# Patient Record
Sex: Male | Born: 1965 | Race: Black or African American | Hispanic: No | Marital: Married | Smoking: Never smoker
Health system: Southern US, Community
[De-identification: ages and names within clinical notes are randomized; demographics above are authoritative.]

## PROBLEM LIST (undated history)

## (undated) DIAGNOSIS — I10 Essential (primary) hypertension: Secondary | ICD-10-CM

## (undated) DIAGNOSIS — G479 Sleep disorder, unspecified: Secondary | ICD-10-CM

## (undated) DIAGNOSIS — K219 Gastro-esophageal reflux disease without esophagitis: Secondary | ICD-10-CM

## (undated) DIAGNOSIS — M25569 Pain in unspecified knee: Secondary | ICD-10-CM

## (undated) DIAGNOSIS — E7800 Pure hypercholesterolemia, unspecified: Secondary | ICD-10-CM

## (undated) HISTORY — PX: HAND SURGERY: SHX662

## (undated) HISTORY — PX: KNEE SURGERY: SHX244

---

## 2014-03-11 ENCOUNTER — Ambulatory Visit: Payer: Self-pay | Admitting: Family Medicine

## 2014-03-22 ENCOUNTER — Ambulatory Visit: Payer: Self-pay | Admitting: Family Medicine

## 2014-12-22 ENCOUNTER — Encounter: Admit: 2014-12-22 | Disposition: A | Discharge status: Home / Self Care | Payer: Self-pay | Admitting: Family Medicine

## 2015-01-07 ENCOUNTER — Encounter
Admit: 2015-01-07 | Disposition: A | Discharge status: Home / Self Care | Payer: Self-pay | Attending: Family Medicine | Admitting: Family Medicine

## 2015-03-17 ENCOUNTER — Other Ambulatory Visit: Payer: Self-pay | Admitting: Family Medicine

## 2015-03-17 DIAGNOSIS — M25512 Pain in left shoulder: Principal | ICD-10-CM

## 2015-03-17 DIAGNOSIS — G8929 Other chronic pain: Secondary | ICD-10-CM

## 2015-03-24 ENCOUNTER — Ambulatory Visit: Admission: RE | Admit: 2015-03-24 | Payer: BC Managed Care – PPO | Source: Ambulatory Visit

## 2016-06-18 ENCOUNTER — Other Ambulatory Visit: Payer: Self-pay | Admitting: Family Medicine

## 2016-06-18 ENCOUNTER — Ambulatory Visit
Admission: RE | Admit: 2016-06-18 | Discharge: 2016-06-18 | Disposition: A | Discharge status: Home / Self Care | Payer: BC Managed Care – PPO | Source: Ambulatory Visit | Attending: Family Medicine | Admitting: Family Medicine

## 2016-06-18 DIAGNOSIS — M545 Low back pain: Secondary | ICD-10-CM | POA: Diagnosis present

## 2016-06-18 DIAGNOSIS — M5136 Other intervertebral disc degeneration, lumbar region: Secondary | ICD-10-CM | POA: Insufficient documentation

## 2016-06-18 DIAGNOSIS — M5134 Other intervertebral disc degeneration, thoracic region: Secondary | ICD-10-CM | POA: Insufficient documentation

## 2016-08-29 ENCOUNTER — Other Ambulatory Visit: Payer: Self-pay | Admitting: Obstetrics and Gynecology

## 2016-08-29 DIAGNOSIS — M545 Low back pain: Secondary | ICD-10-CM

## 2016-09-13 ENCOUNTER — Telehealth: Payer: Self-pay

## 2016-09-14 ENCOUNTER — Other Ambulatory Visit: Payer: BC Managed Care – PPO

## 2016-09-14 ENCOUNTER — Ambulatory Visit: Payer: BC Managed Care – PPO

## 2016-09-27 ENCOUNTER — Ambulatory Visit

## 2016-11-29 ENCOUNTER — Ambulatory Visit
Admission: RE | Admit: 2016-11-29 | Discharge: 2016-11-29 | Disposition: A | Discharge status: Home / Self Care | Payer: BC Managed Care – PPO | Source: Ambulatory Visit | Attending: Obstetrics and Gynecology | Admitting: Obstetrics and Gynecology

## 2016-11-29 DIAGNOSIS — M5126 Other intervertebral disc displacement, lumbar region: Secondary | ICD-10-CM | POA: Insufficient documentation

## 2016-11-29 DIAGNOSIS — M48061 Spinal stenosis, lumbar region without neurogenic claudication: Secondary | ICD-10-CM | POA: Insufficient documentation

## 2016-11-29 DIAGNOSIS — M545 Low back pain: Secondary | ICD-10-CM

## 2018-10-26 ENCOUNTER — Other Ambulatory Visit: Payer: Self-pay

## 2018-10-26 ENCOUNTER — Ambulatory Visit
Admission: EM | Admit: 2018-10-26 | Discharge: 2018-10-26 | Disposition: A | Discharge status: Home / Self Care | Payer: BC Managed Care – PPO | Attending: Family Medicine | Admitting: Family Medicine

## 2018-10-26 ENCOUNTER — Encounter: Payer: Self-pay | Admitting: Gynecology

## 2018-10-26 DIAGNOSIS — B029 Zoster without complications: Secondary | ICD-10-CM | POA: Insufficient documentation

## 2018-10-26 HISTORY — DX: Essential (primary) hypertension: I10

## 2018-10-26 HISTORY — DX: Pure hypercholesterolemia, unspecified: E78.00

## 2018-10-26 HISTORY — DX: Sleep disorder, unspecified: G47.9

## 2018-10-26 HISTORY — DX: Gastro-esophageal reflux disease without esophagitis: K21.9

## 2018-10-26 HISTORY — DX: Pain in unspecified knee: M25.569

## 2018-10-26 MED ORDER — VALACYCLOVIR HCL 1 G PO TABS: 1000.0000 mg | ORAL_TABLET | Freq: Three times a day (TID) | ORAL | 0 refills | Status: AC

## 2018-10-26 NOTE — ED Provider Notes (Signed)
[facility]    CSN: [id number] Arrival date & time: 10/26/18  1217     History   Chief Complaint Chief Complaint  Patient presents with  . Rash    HPI Ronald Woods is a 53 y.o. male.   53 yo male with a c/o a painful rash on right upper back and extending around the side towards the chest. Denies any fevers, chills.   The history is provided by the patient.    Past Medical History:  Diagnosis Date  . GERD (gastroesophageal reflux disease)   . Hypercholesteremia   . Hypertension   . Knee pain   . Sleep disorder     There are no active problems to display for this patient.   Past Surgical History:  Procedure Laterality Date  . HAND SURGERY    . KNEE SURGERY         Home Medications    Prior to Admission medications   Medication Sig Start Date End Date Taking? Authorizing Provider  amoxicillin (AMOXIL) 500 MG tablet Take 500 mg by mouth 2 (two) times daily.   Yes [provider]  atorvastatin (LIPITOR) 40 MG tablet Take by mouth. 07/12/15  Yes [provider]  cetirizine-pseudoephedrine (ZYRTEC-D) 5-120 MG tablet  06/15/14  Yes [provider]  Cholecalciferol (D 5000) 125 MCG (5000 UT) capsule Take by mouth. 06/15/14  Yes [provider]  diclofenac sodium (VOLTAREN) 1 % GEL Apply 1 % topically. 12/07/15  Yes [provider]  DULoxetine (CYMBALTA) 60 MG capsule Take by mouth. 06/18/18  Yes [provider]  fluticasone (FLONASE) 50 MCG/ACT nasal spray 1 spray by Each Nare route daily as needed. 06/15/14  Yes [provider]  gabapentin (NEURONTIN) 300 MG capsule Take by mouth. 05/28/17  Yes [provider]  Ibuprofen 200 MG CAPS Take by mouth. 04/23/05  Yes [provider]  meloxicam (MOBIC) 15 MG tablet Take by mouth. 05/28/17  Yes [provider]  orlistat (XENICAL) 120 MG capsule USE THREE TIMES DAILY AS NEEDED PRIOR TO FATTY MEALS 06/28/16  Yes [provider]   traZODone (DESYREL) 50 MG tablet Take 1/2 to 1 tablet at bed time as needed for sleep and mood 08/27/18  Yes [provider]  docusate sodium (COLACE) 100 MG capsule Take by mouth.    [provider]  Omeprazole 20 MG TBEC Take by mouth.    [provider]  oxyCODONE (ROXICODONE) 15 MG immediate release tablet Take by mouth.    [provider]  valACYclovir (VALTREX) 1000 MG tablet Take 1 tablet (1,000 mg total) by mouth 3 (three) times daily. 10/26/18   Payton Mccallum, MD    Family History Family History  Problem Relation Age of Onset  . Cancer Mother   . Cancer Father     Social History Social History   Tobacco Use  . Smoking status: Never Smoker  . Smokeless tobacco: Never Used  Substance Use Topics  . Alcohol use: Yes  . Drug use: Never     Allergies   Patient has no known allergies.   Review of Systems Review of Systems   Physical Exam Triage Vital Signs ED Triage Vitals [10/26/18 1233]  Enc Vitals Group     BP 127/90     Pulse Rate 79     Resp 16     Temp 98.1 F (36.7 C)     Temp Source Oral     SpO2 99 %  Weight 23 lb (10.4 kg)     Height 5\' 11"  (1.803 m)     Head Circumference      Peak Flow      Pain Score 6     Pain Loc      Pain Edu?      Excl. in GC?    No data found.  Updated Vital Signs BP 127/90 (BP Location: Left Arm)   Pulse 79   Temp 98.1 F (36.7 C) (Oral)   Resp 16   Ht 5\' 11"  (1.803 m)   Wt 10.4 kg   SpO2 99%   BMI 3.21 kg/m   Visual Acuity Right Eye Distance:   Left Eye Distance:   Bilateral Distance:    Right Eye Near:   Left Eye Near:    Bilateral Near:     Physical Exam Vitals signs and nursing note reviewed.  Constitutional:      General: He is not in acute distress.    Appearance: Normal appearance. He is not ill-appearing, toxic-appearing or diaphoretic.  Skin:    Findings: Rash present. Rash is vesicular.       Neurological:     Mental Status: He is alert.       UC Treatments / Results  Labs (all labs ordered are listed, but only abnormal results are displayed) Labs Reviewed - No data to display  EKG None  Radiology No results found.  Procedures Procedures (including critical care time)  Medications Ordered in UC Medications - No data to display  Initial Impression / Assessment and Plan / UC Course  I have reviewed the triage vital signs and the nursing notes.  Pertinent labs & imaging results that were available during my care of the patient were reviewed by me and considered in my medical decision making (see chart for details).      Final Clinical Impressions(s) / UC Diagnoses   Final diagnoses:  Herpes zoster without complication    ED Prescriptions    Medication Sig Dispense Auth. Provider   valACYclovir (VALTREX) 1000 MG tablet Take 1 tablet (1,000 mg total) by mouth 3 (three) times daily. 21 tablet Payton Mccallumonty, Lindsea Olivar, MD     1. diagnosis reviewed with patient 2. rx as per orders above; reviewed possible side effects, interactions, risks and benefits  3. Recommend supportive treatment with otc analgesics prn 4. Follow-up prn if symptoms worsen or don't improve   Controlled Substance Prescriptions [place] Controlled Substance Registry consulted? Not Applicable   Payton Mccallumonty, Khaya Theissen, MD 10/26/18 1253

## 2018-10-26 NOTE — ED Triage Notes (Signed)
Patient c/o blister rash at upper back. Per patient very painful

## 2018-11-11 ENCOUNTER — Ambulatory Visit
Admission: RE | Admit: 2018-11-11 | Discharge: 2018-11-11 | Disposition: A | Discharge status: Home / Self Care | Source: Ambulatory Visit | Attending: Obstetrics and Gynecology | Admitting: Obstetrics and Gynecology

## 2018-11-11 ENCOUNTER — Other Ambulatory Visit: Payer: Self-pay | Admitting: Obstetrics and Gynecology

## 2018-11-11 DIAGNOSIS — J069 Acute upper respiratory infection, unspecified: Secondary | ICD-10-CM

## 2018-11-11 DIAGNOSIS — R918 Other nonspecific abnormal finding of lung field: Secondary | ICD-10-CM | POA: Insufficient documentation

## 2019-12-28 ENCOUNTER — Ambulatory Visit: Attending: Internal Medicine

## 2019-12-28 DIAGNOSIS — Z23 Encounter for immunization: Secondary | ICD-10-CM

## 2019-12-28 NOTE — Progress Notes (Signed)
   Covid-19 Vaccination Clinic  Name:  Ronald Woods    MRN: [id number] DOB: 1965/12/12  12/28/2019  [name] was observed post Covid-19 immunization for 15 minutes without incident. He was provided with Vaccine Information Sheet and instruction to access the V-Safe system.   [name] was instructed to call 911 with any severe reactions post vaccine: Marland Kitchen Difficulty breathing  . Swelling of face and throat  . A fast heartbeat  . A bad rash all over body  . Dizziness and weakness   Immunizations Administered    Name Date Dose VIS Date Route   Pfizer COVID-19 Vaccine 12/28/2019 12:14 PM 0.3 mL 09/18/2019 Intramuscular   Manufacturer: [employer], [employer]   Lot: [id number]   NDC: [address]-2

## 2020-01-20 ENCOUNTER — Ambulatory Visit: Attending: Internal Medicine

## 2020-01-20 DIAGNOSIS — Z23 Encounter for immunization: Secondary | ICD-10-CM

## 2020-01-20 NOTE — Progress Notes (Signed)
   Covid-19 Vaccination Clinic  Name:  Ronald Woods    MRN: [id number] DOB: September 12, 1966  01/20/2020  Ronald Woods was observed post Covid-19 immunization for 15 minutes without incident. He was provided with Vaccine Information Sheet and instruction to access the V-Safe system.   Ronald Woods was instructed to call 911 with any severe reactions post vaccine: Marland Kitchen Difficulty breathing  . Swelling of face and throat  . A fast heartbeat  . A bad rash all over body  . Dizziness and weakness   Immunizations Administered    Name Date Dose VIS Date Route   Pfizer COVID-19 Vaccine 01/20/2020  1:31 PM 0.3 mL 09/18/2019 Intramuscular   Manufacturer: [employer], [employer]   Lot: [id number]   NDC: [address]-3

## 2020-09-19 ENCOUNTER — Other Ambulatory Visit: Payer: Self-pay | Admitting: Obstetrics and Gynecology

## 2020-09-19 DIAGNOSIS — M25562 Pain in left knee: Secondary | ICD-10-CM

## 2020-09-19 DIAGNOSIS — R609 Edema, unspecified: Secondary | ICD-10-CM

## 2020-09-27 ENCOUNTER — Ambulatory Visit
Admission: RE | Admit: 2020-09-27 | Discharge: 2020-09-27 | Disposition: A | Discharge status: Home / Self Care | Source: Ambulatory Visit | Attending: Obstetrics and Gynecology | Admitting: Obstetrics and Gynecology

## 2020-09-27 ENCOUNTER — Other Ambulatory Visit (HOSPITAL_COMMUNITY): Payer: Self-pay | Admitting: Obstetrics and Gynecology

## 2020-09-27 ENCOUNTER — Other Ambulatory Visit: Payer: Self-pay

## 2020-09-27 DIAGNOSIS — M25562 Pain in left knee: Secondary | ICD-10-CM | POA: Diagnosis present

## 2020-09-27 DIAGNOSIS — R609 Edema, unspecified: Secondary | ICD-10-CM | POA: Diagnosis present

## 2020-09-27 DIAGNOSIS — S83206A Unspecified tear of unspecified meniscus, current injury, right knee, initial encounter: Secondary | ICD-10-CM

## 2020-09-27 DIAGNOSIS — M25561 Pain in right knee: Secondary | ICD-10-CM

## 2020-09-27 MED ORDER — GADOBUTROL 1 MMOL/ML IV SOLN
10.0000 mL | Freq: Once | INTRAVENOUS | Status: AC | PRN
Start: 2020-09-27 — End: 2020-09-27
  Administered 2020-09-27: 11:00:00 10 mL via INTRAVENOUS

## 2020-10-05 ENCOUNTER — Other Ambulatory Visit: Payer: Self-pay | Admitting: Obstetrics and Gynecology

## 2020-10-05 ENCOUNTER — Ambulatory Visit: Admission: RE | Admit: 2020-10-05 | Source: Ambulatory Visit

## 2020-10-05 DIAGNOSIS — S83206A Unspecified tear of unspecified meniscus, current injury, right knee, initial encounter: Secondary | ICD-10-CM

## 2020-10-05 DIAGNOSIS — M25561 Pain in right knee: Secondary | ICD-10-CM

## 2020-10-11 ENCOUNTER — Ambulatory Visit
Admission: RE | Admit: 2020-10-11 | Discharge: 2020-10-11 | Disposition: A | Discharge status: Home / Self Care | Payer: Medicare HMO | Source: Ambulatory Visit | Attending: Obstetrics and Gynecology | Admitting: Obstetrics and Gynecology

## 2020-10-11 ENCOUNTER — Other Ambulatory Visit: Payer: Self-pay

## 2020-10-11 ENCOUNTER — Other Ambulatory Visit: Payer: Self-pay | Admitting: Obstetrics and Gynecology

## 2020-10-11 DIAGNOSIS — S83206A Unspecified tear of unspecified meniscus, current injury, right knee, initial encounter: Secondary | ICD-10-CM | POA: Diagnosis present

## 2020-10-11 DIAGNOSIS — M25561 Pain in right knee: Secondary | ICD-10-CM

## 2020-10-11 MED ORDER — GADOBUTROL 1 MMOL/ML IV SOLN: 10.0000 mL | Freq: Once | INTRAVENOUS | Status: DC | PRN

## 2020-12-29 ENCOUNTER — Ambulatory Visit
Admission: RE | Admit: 2020-12-29 | Discharge: 2020-12-29 | Disposition: A | Discharge status: Home / Self Care | Payer: Medicare HMO | Source: Ambulatory Visit | Attending: Obstetrics and Gynecology | Admitting: Obstetrics and Gynecology

## 2020-12-29 ENCOUNTER — Other Ambulatory Visit: Payer: Self-pay | Admitting: Obstetrics and Gynecology

## 2020-12-29 DIAGNOSIS — R051 Acute cough: Secondary | ICD-10-CM

## 2022-09-16 IMAGING — CR DG CHEST 2V
1 series · 2 of 2 positions shown · non-contrast
Comparison: Radiograph 11/11/2018

CLINICAL DATA: Acute cough. Patient reports cough and chest pain
for 1 month.

EXAM:
CHEST - 2 VIEW

[Series 1: dg chest 2 view · 0.14mm/px · 2 of 2 slices shown]
[im 1/2]
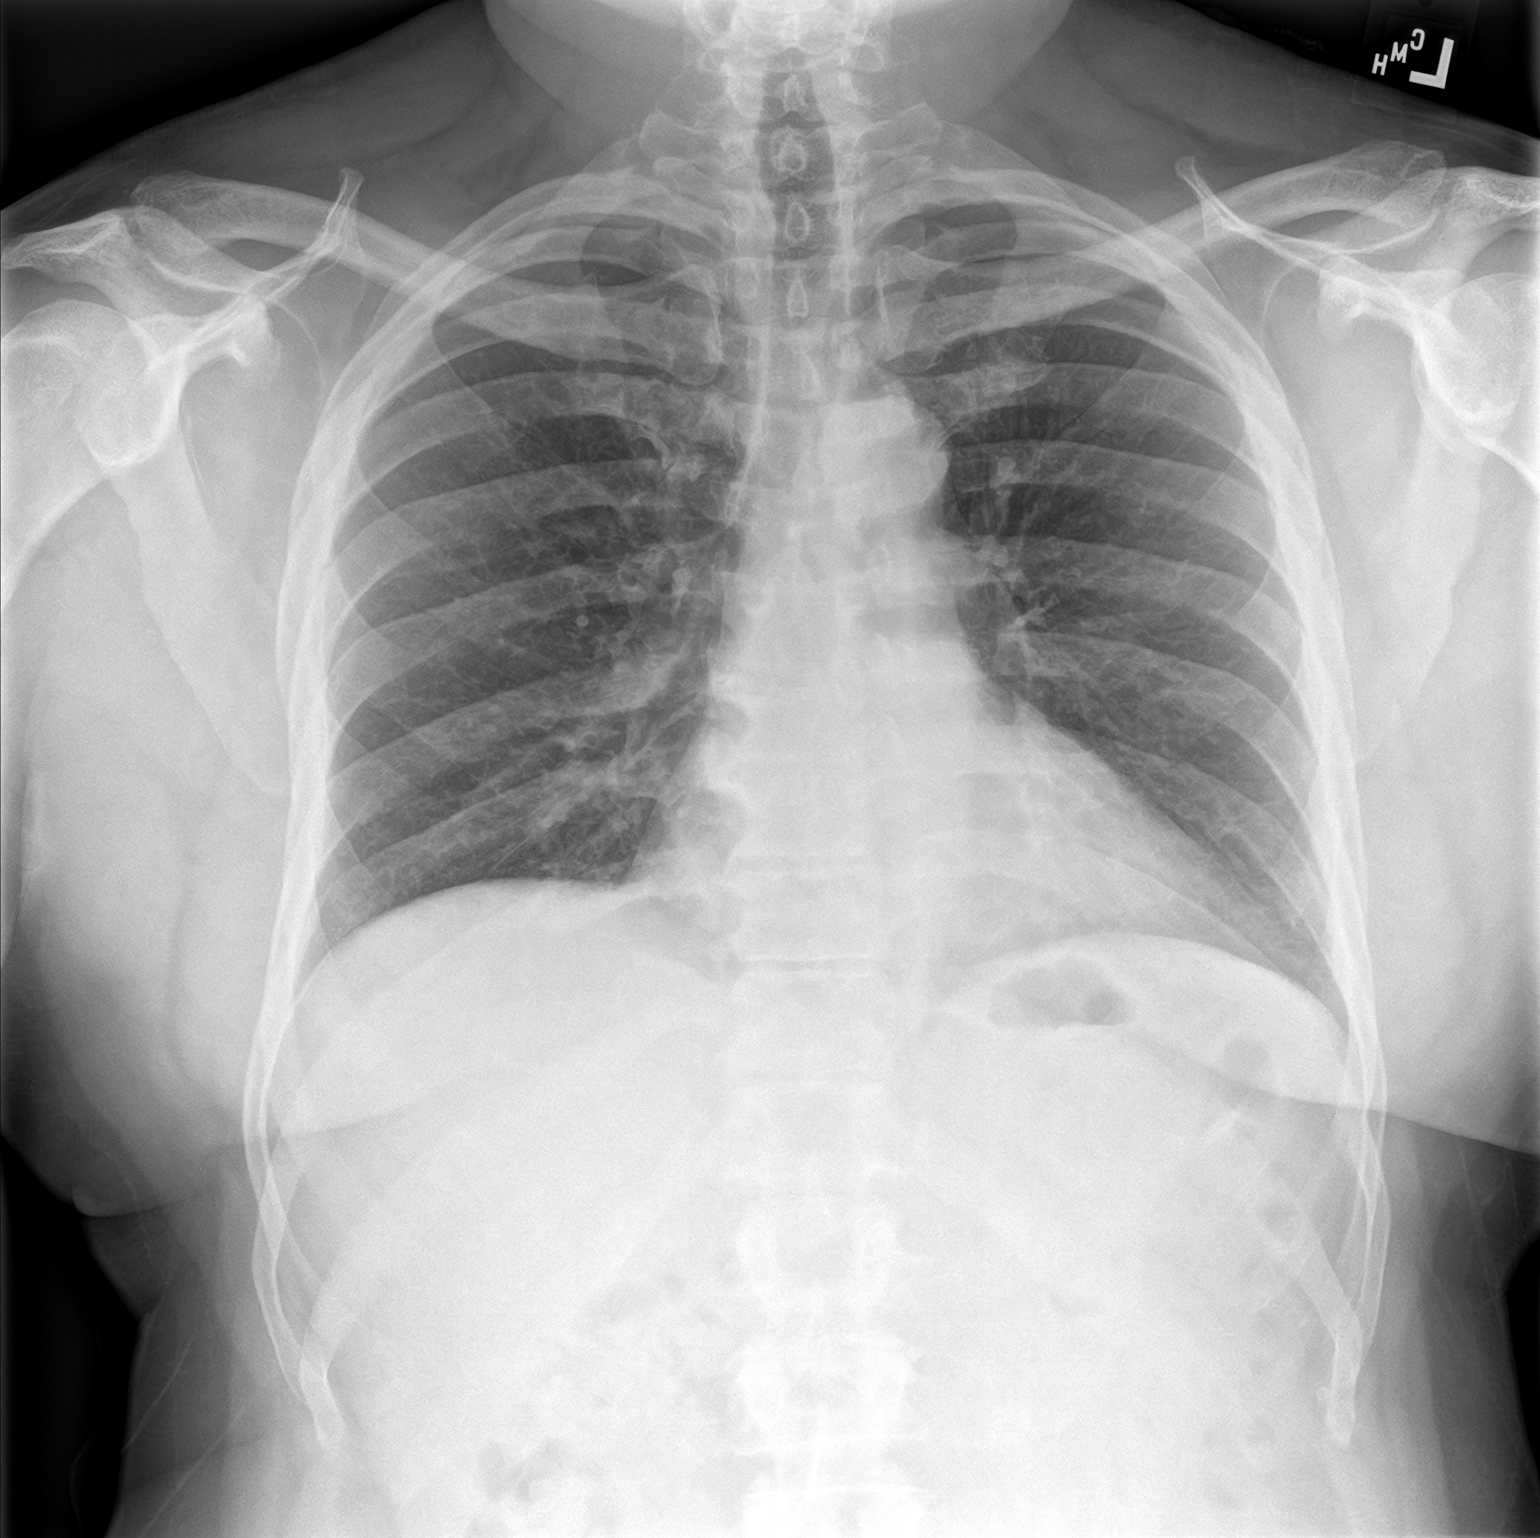
[im 2/2]
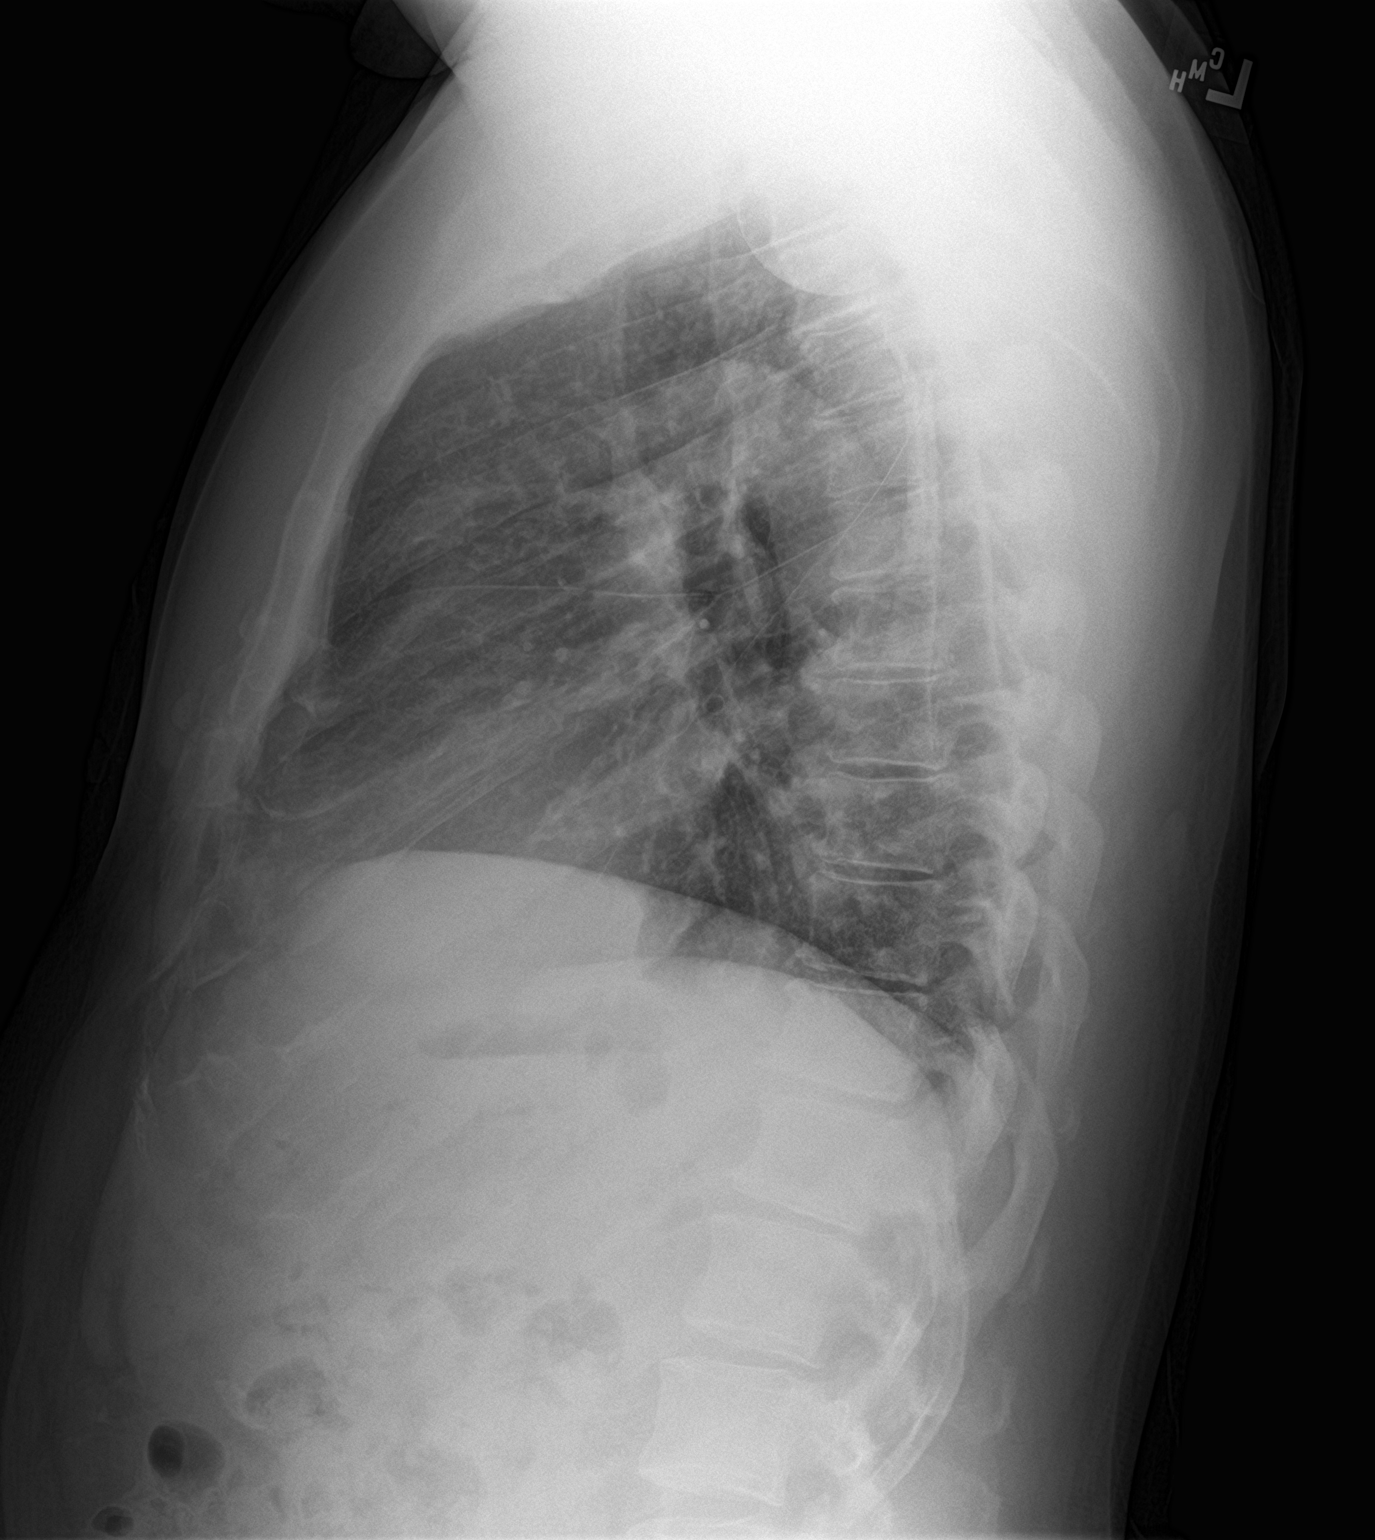

[2 of 2 positions shown; findings below may reference images not displayed]

FINDINGS: The cardiomediastinal contours are normal. Previous right basilar
opacity has improved. There is mild bronchial thickening, similar to
prior exam. Pulmonary vasculature is normal. No consolidation,
pleural effusion, or pneumothorax. No acute osseous abnormalities
are seen.
IMPRESSION: Mild bronchial thickening, similar to prior exam. This may represent
bronchitis or asthma. No focal airspace disease.
